# Patient Record
Sex: Male | Born: 1950 | Race: White | Hispanic: No | Marital: Married | Smoking: Never smoker
Health system: Southern US, Community
[De-identification: ages and names within clinical notes are randomized; demographics above are authoritative.]

## PROBLEM LIST (undated history)

## (undated) HISTORY — PX: HERNIA REPAIR: SHX51

## (undated) HISTORY — PX: VASECTOMY: SHX75

## (undated) HISTORY — PX: COLONOSCOPY: SHX174

---

## 2008-04-11 ENCOUNTER — Ambulatory Visit: Payer: Self-pay | Admitting: Unknown Physician Specialty

## 2015-03-20 ENCOUNTER — Other Ambulatory Visit: Payer: Self-pay | Admitting: Physician Assistant

## 2015-03-20 ENCOUNTER — Encounter: Payer: Self-pay | Admitting: Physician Assistant

## 2015-03-20 VITALS — BP 110/60 | HR 70 | Temp 98.0°F

## 2015-03-20 DIAGNOSIS — Z0000 Encounter for general adult medical examination without abnormal findings: Secondary | ICD-10-CM

## 2015-03-20 NOTE — Progress Notes (Signed)
S: needs labwork, likes to keep a check on his psa due to family hx, also family hx diabetes, no complaints, ros neg  O: vitals wnl, nad, lungs c t a, cv rrr, moves extremeties easily, n/v intact  A: yearly labs  P: fasting male exec panel, hgbA1c, vit d

## 2015-03-21 LAB — CMP12+LP+TP+TSH+6AC+PSA+CBC…
ALBUMIN: 4.6 g/dL (ref 3.6–4.8)
ALK PHOS: 60 IU/L (ref 39–117)
ALT: 12 IU/L (ref 0–44)
AST: 24 IU/L (ref 0–40)
Albumin/Globulin Ratio: 2.1 (ref 1.1–2.5)
BASOS ABS: 0 10*3/uL (ref 0.0–0.2)
BILIRUBIN TOTAL: 0.9 mg/dL (ref 0.0–1.2)
BUN / CREAT RATIO: 14 (ref 10–22)
BUN: 13 mg/dL (ref 8–27)
Basos: 1 %
CHLORIDE: 102 mmol/L (ref 97–106)
CHOLESTEROL TOTAL: 130 mg/dL (ref 100–199)
Calcium: 9.3 mg/dL (ref 8.6–10.2)
Chol/HDL Ratio: 3.3 ratio units (ref 0.0–5.0)
Creatinine, Ser: 0.92 mg/dL (ref 0.76–1.27)
EOS (ABSOLUTE): 0.1 10*3/uL (ref 0.0–0.4)
EOS: 2 %
FREE THYROXINE INDEX: 2.1 (ref 1.2–4.9)
GFR calc non Af Amer: 88 mL/min/{1.73_m2} (ref >59)
GFR, EST AFRICAN AMERICAN: 102 mL/min/{1.73_m2} (ref >59)
GGT: 14 IU/L (ref 0–65)
GLUCOSE: 96 mg/dL (ref 65–99)
Globulin, Total: 2.2 g/dL (ref 1.5–4.5)
HDL: 39 mg/dL — AB (ref >39)
HEMOGLOBIN: 15.5 g/dL (ref 12.6–17.7)
Hematocrit: 45.6 % (ref 37.5–51.0)
IMMATURE GRANS (ABS): 0 10*3/uL (ref 0.0–0.1)
IMMATURE GRANULOCYTES: 0 %
Iron: 175 ug/dL — ABNORMAL HIGH (ref 38–169)
LDH: 155 IU/L (ref 121–224)
LDL CALC: 75 mg/dL (ref 0–99)
LYMPHS: 21 %
Lymphocytes Absolute: 0.9 10*3/uL (ref 0.7–3.1)
MCH: 30.2 pg (ref 26.6–33.0)
MCHC: 34 g/dL (ref 31.5–35.7)
MCV: 89 fL (ref 79–97)
MONOCYTES: 11 %
Monocytes Absolute: 0.5 10*3/uL (ref 0.1–0.9)
NEUTROS PCT: 65 %
Neutrophils Absolute: 2.8 10*3/uL (ref 1.4–7.0)
PLATELETS: 172 10*3/uL (ref 150–379)
POTASSIUM: 4.8 mmol/L (ref 3.5–5.2)
PROSTATE SPECIFIC AG, SERUM: 1.6 ng/mL (ref 0.0–4.0)
Phosphorus: 2.7 mg/dL (ref 2.5–4.5)
RBC: 5.13 x10E6/uL (ref 4.14–5.80)
RDW: 13.5 % (ref 12.3–15.4)
SODIUM: 143 mmol/L (ref 136–144)
T3 Uptake Ratio: 30 % (ref 24–39)
T4, Total: 7.1 ug/dL (ref 4.5–12.0)
TSH: 1.27 u[IU]/mL (ref 0.450–4.500)
Total Protein: 6.8 g/dL (ref 6.0–8.5)
Triglycerides: 80 mg/dL (ref 0–149)
Uric Acid: 6.1 mg/dL (ref 3.7–8.6)
VLDL CHOLESTEROL CAL: 16 mg/dL (ref 5–40)
WBC: 4.3 10*3/uL (ref 3.4–10.8)

## 2015-03-21 LAB — VITAMIN D 25 HYDROXY (VIT D DEFICIENCY, FRACTURES): VIT D 25 HYDROXY: 30.4 ng/mL (ref 30.0–100.0)

## 2015-03-21 LAB — HEMOGLOBIN A1C
Est. average glucose Bld gHb Est-mCnc: 120 mg/dL
HEMOGLOBIN A1C: 5.8 % — AB (ref 4.8–5.6)

## 2015-03-22 NOTE — Patient Instructions (Signed)
Prediabetes Eating Plan Prediabetes--also called impaired glucose tolerance or impaired fasting glucose--is a condition that causes blood sugar (blood glucose) levels to be higher than normal. Following a healthy diet can help to keep prediabetes under control. It can also help to lower the risk of type 2 diabetes and heart disease, which are increased in people who have prediabetes. Along with regular exercise, a healthy diet:  Promotes weight loss.  Helps to control blood sugar levels.  Helps to improve the way that the body uses insulin. WHAT DO I NEED TO KNOW ABOUT THIS EATING PLAN?  Use the glycemic index (GI) to plan your meals. The index tells you how quickly a food will raise your blood sugar. Choose low-GI foods. These foods take a longer time to raise blood sugar.  Pay close attention to the amount of carbohydrates in the food that you eat. Carbohydrates increase blood sugar levels.  Keep track of how many calories you take in. Eating the right amount of calories will help you to achieve a healthy weight. Losing about 7 percent of your starting weight can help to prevent type 2 diabetes.  You may want to follow a Mediterranean diet. This diet includes a lot of vegetables, lean meats or fish, whole grains, fruits, and healthy oils and fats. WHAT FOODS CAN I EAT? Grains Whole grains, such as whole-wheat or whole-grain breads, crackers, cereals, and pasta. Unsweetened oatmeal. Bulgur. Barley. Quinoa. Brown rice. Corn or whole-wheat flour tortillas or taco shells. Vegetables Lettuce. Spinach. Peas. Beets. Cauliflower. Cabbage. Broccoli. Carrots. Tomatoes. Squash. Eggplant. Herbs. Peppers. Onions. Cucumbers. Brussels sprouts. Fruits Berries. Bananas. Apples. Oranges. Grapes. Papaya. Mango. Pomegranate. Kiwi. Grapefruit. Cherries. Meats and Other Protein Sources Seafood. Lean meats, such as chicken and [place] or lean cuts of pork and beef. Tofu. Eggs. Nuts. Beans. Dairy Low-fat or  fat-free dairy products, such as yogurt, cottage cheese, and cheese. Beverages Water. Tea. Coffee. Sugar-free or diet soda. Seltzer water. Milk. Milk alternatives, such as soy or almond milk. Condiments Mustard. Relish. Low-fat, low-sugar ketchup. Low-fat, low-sugar barbecue sauce. Low-fat or fat-free mayonnaise. Sweets and Desserts Sugar-free or low-fat pudding. Sugar-free or low-fat ice cream and other frozen treats. Fats and Oils Avocado. Walnuts. Olive oil. The items listed above may not be a complete list of recommended foods or beverages. Contact your dietitian for more options.  WHAT FOODS ARE NOT RECOMMENDED? Grains Refined white flour and flour products, such as bread, pasta, snack foods, and cereals. Beverages Sweetened drinks, such as sweet iced tea and soda. Sweets and Desserts Baked goods, such as cake, cupcakes, pastries, cookies, and cheesecake. The items listed above may not be a complete list of foods and beverages to avoid. Contact your dietitian for more information.   This information is not intended to replace advice given to you by your health care provider. Make sure you discuss any questions you have with your health care provider.   Document Released: 10/02/2014 Document Reviewed: 10/02/2014 Elsevier Interactive Patient Education 2016 [employer] Hemoglobin A1c Test Some of the sugar (glucose) that circulates in your blood sticks or binds to blood proteins. Hemoglobin (Hb or Hgb) is one type of blood protein that glucose binds to. It also carries oxygen in the red blood cells (RBCs). When glucose binds to Hb, the glucose-coated Hb is called glycated Hb. Once Hb is glycated, it remains that way for the life of the RBC. This is about 120 days. Rather than testing your blood glucose level on one single day, the hemoglobin A1c (  HbA1c) test measures the average amount of glycated hemoglobin and, therefore, the average amount of glucose in your blood during the 3-4  months just before the test is done. The HbA1c test is used to monitor long-term control of blood sugar in people who have diabetes mellitus. The HbA1c test can also be used in addition to or in combination with fasting blood glucose level and oral glucose tolerance tests. RESULTS It is your responsibility to obtain your test results. Ask the lab or department performing the test when and how you will get your results. Contact your health care provider to discuss any questions you have about your results. Range of Normal Values Ranges for normal values may vary among different labs and hospitals. You should always check with your health care provider after having lab work or other tests done to discuss the meaning of your test results and whether your values are considered within normal limits. The ranges for normal HbA1c test results are as follows:  Adult or child without diabetes: 4-5.9%.  Adult or child with diabetes and good blood glucose control: less than 6.5%. Several factors can affect HbA1c test results. These may include:  Diseases (hemoglobinopathies) that cause a change in the shape, size, or amount of Hb in your blood.  Longer than normal RBC life span.  Abnormally low levels of certain proteins in your blood.  Eating foods or taking supplements that are high in vitamin C (ascorbic acid). Meaning of Results Outside Normal Value Ranges Abnormally high HbA1c values are most commonly an indication of prediabetes mellitus and diabetes mellitus:  An HbA1c result of 5.7-6.4% is considered diagnostic of prediabetes mellitus.  An HbA1c result of 6.5% or higher on two separate occasions is considered diagnostic of diabetes mellitus. Abnormally low HbA1c values can be caused by several health conditions. These may include:  Pregnancy.  A large amount of blood loss.  Blood transfusions.  Low red blood cell count (anemia). This is caused by premature destruction of red blood  cells.  Long-term kidney failure.  Some unusual forms of Hb (Hb variants), such as sickle cell trait. Discuss your test results with your health care provider. He or she will use the results to make a diagnosis and determine a treatment plan that is right for you.   This information is not intended to replace advice given to you by your health care provider. Make sure you discuss any questions you have with your health care provider.   Document Released: 06/09/2004 Document Revised: 06/08/2014 Document Reviewed: 10/02/2013 Elsevier Interactive Patient Education 2016 Elsevier Inc.  

## 2015-03-22 NOTE — Progress Notes (Signed)
Mailed Lab result and Prediabetic sheets.  SBrown

## 2015-05-10 ENCOUNTER — Ambulatory Visit: Payer: Self-pay | Admitting: Physician Assistant

## 2015-10-02 ENCOUNTER — Ambulatory Visit: Payer: Self-pay | Admitting: Physician Assistant

## 2015-10-02 ENCOUNTER — Encounter: Payer: Self-pay | Admitting: Physician Assistant

## 2015-10-02 VITALS — BP 100/70 | HR 66 | Temp 97.8°F

## 2015-10-02 DIAGNOSIS — M25532 Pain in left wrist: Secondary | ICD-10-CM

## 2015-10-02 DIAGNOSIS — Z299 Encounter for prophylactic measures, unspecified: Secondary | ICD-10-CM

## 2015-10-02 NOTE — Progress Notes (Signed)
S: keeps getting sharp shooting pain in left wrist, happens randomly, no known injury, is r handed, doesn't happen while typing, etc,; has been wearing wrist splint at night  O: vitals wnl, nad, skin intact no bruising or redness noted, no bony tenderness noted, full rom, neg phalens and tinels sign, n/v intact  A: acute wrist pain  P: f/u with [facility] for eval of pain, ? If from medial nerve

## 2015-10-03 LAB — CMP12+LP+TP+TSH+6AC+PSA+CBC…
A/G RATIO: 1.8 (ref 1.2–2.2)
ALT: 17 IU/L (ref 0–44)
AST: 20 IU/L (ref 0–40)
Albumin: 4.4 g/dL (ref 3.6–4.8)
Alkaline Phosphatase: 60 IU/L (ref 39–117)
BUN/Creatinine Ratio: 15 (ref 10–24)
BUN: 13 mg/dL (ref 8–27)
Basophils Absolute: 0 10*3/uL (ref 0.0–0.2)
Basos: 1 %
Bilirubin Total: 0.7 mg/dL (ref 0.0–1.2)
CALCIUM: 9.1 mg/dL (ref 8.6–10.2)
CHLORIDE: 104 mmol/L (ref 96–106)
CHOLESTEROL TOTAL: 133 mg/dL (ref 100–199)
CREATININE: 0.85 mg/dL (ref 0.76–1.27)
Chol/HDL Ratio: 3.2 ratio units (ref 0.0–5.0)
EOS (ABSOLUTE): 0.1 10*3/uL (ref 0.0–0.4)
Eos: 2 %
Estimated CHD Risk: <0.5 times avg. (ref 0.0–1.0)
FREE THYROXINE INDEX: 2.2 (ref 1.2–4.9)
GFR calc Af Amer: 106 mL/min/{1.73_m2} (ref >59)
GFR, EST NON AFRICAN AMERICAN: 92 mL/min/{1.73_m2} (ref >59)
GGT: 15 IU/L (ref 0–65)
GLUCOSE: 100 mg/dL — AB (ref 65–99)
Globulin, Total: 2.4 g/dL (ref 1.5–4.5)
HDL: 42 mg/dL (ref >39)
Hematocrit: 45.8 % (ref 37.5–51.0)
Hemoglobin: 15.1 g/dL (ref 12.6–17.7)
IRON: 123 ug/dL (ref 38–169)
Immature Grans (Abs): 0 10*3/uL (ref 0.0–0.1)
Immature Granulocytes: 0 %
LDH: 149 IU/L (ref 121–224)
LDL Calculated: 78 mg/dL (ref 0–99)
LYMPHS: 23 %
Lymphocytes Absolute: 1.1 10*3/uL (ref 0.7–3.1)
MCH: 30 pg (ref 26.6–33.0)
MCHC: 33 g/dL (ref 31.5–35.7)
MCV: 91 fL (ref 79–97)
MONOS ABS: 0.5 10*3/uL (ref 0.1–0.9)
Monocytes: 10 %
NEUTROS ABS: 3 10*3/uL (ref 1.4–7.0)
Neutrophils: 64 %
PHOSPHORUS: 2.7 mg/dL (ref 2.5–4.5)
POTASSIUM: 4.3 mmol/L (ref 3.5–5.2)
Platelets: 177 10*3/uL (ref 150–379)
Prostate Specific Ag, Serum: 1.7 ng/mL (ref 0.0–4.0)
RBC: 5.04 x10E6/uL (ref 4.14–5.80)
RDW: 12.5 % (ref 12.3–15.4)
Sodium: 142 mmol/L (ref 134–144)
T3 UPTAKE RATIO: 32 % (ref 24–39)
T4 TOTAL: 7 ug/dL (ref 4.5–12.0)
TRIGLYCERIDES: 66 mg/dL (ref 0–149)
TSH: 1.4 u[IU]/mL (ref 0.450–4.500)
Total Protein: 6.8 g/dL (ref 6.0–8.5)
URIC ACID: 6.3 mg/dL (ref 3.7–8.6)
VLDL Cholesterol Cal: 13 mg/dL (ref 5–40)
WBC: 4.7 10*3/uL (ref 3.4–10.8)

## 2015-10-04 LAB — SPECIMEN STATUS REPORT

## 2015-10-04 LAB — HGB A1C W/O EAG: HEMOGLOBIN A1C: 5.8 % — AB (ref 4.8–5.6)

## 2016-05-08 ENCOUNTER — Ambulatory Visit: Payer: Self-pay | Admitting: Registered Nurse

## 2016-05-08 ENCOUNTER — Encounter: Payer: Self-pay | Admitting: Registered Nurse

## 2016-05-08 VITALS — BP 130/70 | HR 78 | Temp 98.5°F

## 2016-05-08 DIAGNOSIS — H6592 Unspecified nonsuppurative otitis media, left ear: Secondary | ICD-10-CM

## 2016-05-08 DIAGNOSIS — J019 Acute sinusitis, unspecified: Secondary | ICD-10-CM

## 2016-05-08 MED ORDER — AMOXICILLIN 500 MG PO CAPS: 500.0000 mg | ORAL_CAPSULE | Freq: Three times a day (TID) | ORAL | 0 refills | Status: AC

## 2016-05-08 MED ORDER — FLUTICASONE PROPIONATE 50 MCG/ACT NA SUSP: 1.0000 | Freq: Two times a day (BID) | NASAL | 0 refills | Status: AC

## 2016-05-08 MED ORDER — SALINE SPRAY 0.65 % NA SOLN: 2.0000 | NASAL | 0 refills | Status: AC | PRN

## 2016-05-08 NOTE — Patient Instructions (Addendum)
Medications as prescribed Aggressive saline nasal use in shower BID Start generic flonase 1 spray each nostril BID after saline use Amoxicillin 500mg  po TID x 10 days for left ear infection

## 2016-05-08 NOTE — Progress Notes (Signed)
Subjective:    Patient ID: Kurt Wiley, male    DOB: Nov 13, 1950, 65 y.o.   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  Patient reported gets sinus infection once a year.  Denied fever, myalgias, ear discharge.  PMHx recurrent sinus infections, seasonal allergies  PSHx denied  FHx denied cancer ENT      Review of Systems  Constitutional: Negative for activity change, appetite change, chills, diaphoresis, fatigue, fever and unexpected weight change.  HENT: Positive for congestion, ear pain, postnasal drip, rhinorrhea, sinus pain, sinus pressure and sore throat. Negative for dental problem, drooling, ear discharge, facial swelling, hearing loss, mouth sores, nosebleeds, sneezing, tinnitus, trouble swallowing and voice change.   Eyes: Negative for photophobia, pain, discharge, redness, itching and visual disturbance.  Respiratory: Negative for cough, choking, chest tightness, shortness of breath, wheezing and stridor.   Cardiovascular: Negative for chest pain, palpitations and leg swelling.  Gastrointestinal: Negative for abdominal distention, abdominal pain, blood in stool, constipation, diarrhea, nausea and vomiting.  Endocrine: Negative for cold intolerance and heat intolerance.  Genitourinary: Negative for dysuria.  Musculoskeletal: Negative for arthralgias, back pain, gait problem, joint swelling, myalgias, neck pain and neck stiffness.  Skin: Negative for color change, pallor, rash and wound.  Allergic/Immunologic: Positive for environmental allergies. Negative for food allergies and immunocompromised state.  Neurological: Positive for headaches. Negative for dizziness, tremors, seizures, syncope, facial  asymmetry, speech difficulty, weakness, light-headedness and numbness.  Hematological: Negative for adenopathy. Does not bruise/bleed easily.  Psychiatric/Behavioral: Negative for agitation, behavioral problems, confusion and sleep disturbance.       Objective:   Physical Exam  Constitutional: He is oriented to person, place, and time. Vital signs are normal. He appears well-developed and well-nourished. He is active and cooperative.  Non-toxic appearance. He does not have a sickly appearance. He does not appear ill. No distress.  HENT:  Head: Normocephalic and atraumatic.  Right Ear: Hearing, external ear and ear canal normal. A middle ear effusion is present.  Left Ear: Hearing, external ear and ear canal normal. Tympanic membrane is erythematous and bulging. A middle ear effusion is present.  Nose: Mucosal edema and rhinorrhea present. No nose lacerations, sinus tenderness, nasal deformity, septal deviation or nasal septal hematoma. No epistaxis.  No foreign bodies. Right sinus exhibits no maxillary sinus tenderness and no frontal sinus tenderness. Left sinus exhibits no maxillary sinus tenderness and no frontal sinus tenderness.  Mouth/Throat: Uvula is midline and mucous membranes are normal. Mucous membranes are not pale, not dry and not cyanotic. He does not have dentures. No oral lesions. No trismus in the jaw. Normal dentition. No dental abscesses, uvula swelling, lacerations or dental caries. Posterior oropharyngeal edema and posterior oropharyngeal erythema present. No oropharyngeal exudate or tonsillar abscesses.  Cobblestoning posterior pharynx; bilateral nasal turbinates edema/erythema clear discharge; left TM bulging erythematous slight opacity air fluid level; right TM air fluid level clear; bilateral allergic shiners  Eyes: Conjunctivae, EOM and lids are normal. Pupils are equal, round, and reactive to light. Right eye exhibits no chemosis, no discharge, no exudate and no hordeolum.  No foreign body present in the right eye. Left eye exhibits no chemosis, no discharge, no exudate and no hordeolum. No foreign body present in the left  eye. Right conjunctiva is not injected. Right conjunctiva has no hemorrhage. Left conjunctiva is not injected. Left conjunctiva has no hemorrhage. No scleral icterus. Right eye exhibits normal extraocular motion and no nystagmus. Left eye exhibits normal extraocular motion and no nystagmus. Right pupil is round and reactive. Left pupil is round and reactive. Pupils are equal.  Neck: Trachea normal, normal range of motion and phonation normal. Neck supple. No tracheal tenderness, no spinous process tenderness and no muscular tenderness present. No neck rigidity. No tracheal deviation, no edema, no erythema and normal range of motion present. No thyroid mass and no thyromegaly present.  Cardiovascular: Normal rate, regular rhythm, S1 normal, S2 normal, normal heart sounds and intact distal pulses.  PMI is not displaced.  Exam reveals no gallop and no friction rub.   No murmur heard. Pulmonary/Chest: Effort normal and breath sounds normal. No stridor. No respiratory distress. He has no decreased breath sounds. He has no wheezes. He has no rhonchi. He has no rales.  Abdominal: Soft. He exhibits no distension.  Musculoskeletal: Normal range of motion. He exhibits no edema or tenderness.       Right shoulder: Normal.       Left shoulder: Normal.       Right elbow: Normal.      Left elbow: Normal.       Right hip: Normal.       Left hip: Normal.       Right knee: Normal.       Left knee: Normal.       Cervical back: Normal.       Thoracic back: Normal.       Lumbar back: Normal.       Right hand: Normal.       Left hand: Normal.  Lymphadenopathy:       Head (right side): No submental, no submandibular, no tonsillar, no preauricular, no posterior auricular and no occipital adenopathy present.       Head (left side): No submental, no submandibular, no  tonsillar, no preauricular, no posterior auricular and no occipital adenopathy present.    He has no cervical adenopathy.       Right cervical: No superficial cervical, no deep cervical and no posterior cervical adenopathy present.      Left cervical: No superficial cervical, no deep cervical and no posterior cervical adenopathy present.  Neurological: He is alert and oriented to person, place, and time. He displays no atrophy and no tremor. No cranial nerve deficit or sensory deficit. He exhibits normal muscle tone. He displays no seizure activity. Coordination and gait normal. GCS eye subscore is 4. GCS verbal subscore is 5. GCS motor subscore is 6.  Skin: Skin is warm, dry and intact. No abrasion, no bruising, no burn, no ecchymosis, no laceration, no lesion, no petechiae and no rash noted. He is not diaphoretic. No cyanosis or erythema. No pallor. Nails show no clubbing.  Psychiatric: He has a normal mood and affect. His speech is normal and behavior is normal. Judgment and thought content normal. Cognition and memory are normal.  Nursing note and vitals reviewed.         Assessment & Plan:  A-acute rhinosinusitis, left otitis media nonsupportive; right otitis media effusion  P-amoxicillin 500mg  po TID x 10 days for left otitis media nonsupportive.  Tylenol 1000mg  po QID prn pain. Supportive treatment.   No evidence of invasive bacterial infection, non toxic and well hydrated.  This is most likely self limiting viral infection.  I do not see where any further testing or imaging is necessary at this time.   I will suggest supportive care, rest, good hygiene and encourage the patient to take adequate fluids.  The patient is to return to clinic or EMERGENCY ROOM if symptoms worsen or change significantly e.g. ear pain, fever, purulent discharge from ears or bleeding.  Exitcare handout on otitis media with effusion given to patient.  Patient verbalized agreement and understanding of treatment plan.     Common in community unlikely flu as no body aches/fever/cough.  Suspect Viral illness: no evidence of invasive bacterial infection, non toxic and well hydrated.  This is most likely self limiting viral infection.  I do not see where any further testing or imaging is necessary at this time.   I will suggest supportive care, rest, good hygiene and encourage the patient to take adequate fluids.  Does not require work excuse. Sudafed 30mg  po q4-6h prn; flonase 1 spray each nostril BID prn, nasal saline 1-2 sprays each nostril prn q2h, motrin 800mg  po TID prn or tylenol 1000mg  po QID prn pain.  Discussed honey with lemon and salt water gargles for comfort also.  The patient is to return to clinic or EMERGENCY ROOM if symptoms worsen or change significantly e.g. fever, lethargy, SOB, wheezing. Patient verbalized agreement and understanding of treatment plan.    May continue sudafed 30mg  1-2 tabs po q4-6h prn rhinitis max 240mg /24 hours.  Hydrate, hydrate, hydrate.  Shower BID.start flonase 1 spray each nostril BID, saline 2 sprays each nostril q2h prn congestion. On amoxicillin 500mg  po TID for ear infection  x 10 days provides coverage for sinusitis also.  Rx given.  No evidence of systemic bacterial infection, non toxic and well hydrated.  I do not see where any further testing or imaging is necessary at this time.   I will suggest supportive care, rest, good hygiene and encourage the patient to take adequate fluids.  The patient is to return to clinic or EMERGENCY ROOM if symptoms worsen or change significantly.  .  Patient verbalized agreement and understanding of treatment plan and had no further questions at this time.   P2:  Hand washing and cover cough  Suspect postnasal drip cause of sore throat but coverage for strep throat also with amoxicillin 500mg  po TID x 10 days for left ear infection.  May use tylenol 1000mg  po QID prn pain. Usually no specific medical treatment is needed if a virus is causing  the sore throat.  The throat most often gets better on its own within 5 to 7 days.  Antibiotic medicine does not cure viral pharyngitis.   For acute pharyngitis caused by bacteria, your healthcare provider will prescribe an antibiotic.  Marland Kitchen Do not smoke.  Marland Kitchen Avoid secondhand smoke and other air pollutants.  . Use a cool mist humidifier to add moisture to the air.  . Get plenty of rest.  . You may want to rest your throat by talking less and eating a diet that is mostly liquid or soft for a day or two.   Marland Kitchen Nonprescription throat lozenges and mouthwashes should help relieve the soreness.   . Gargling with warm saltwater and drinking warm liquids may help.  (You can make a saltwater solution by adding 1/4 teaspoon of salt to 8 ounces, or 240 mL, of warm water.)  . A nonprescription pain reliever such as aspirin, acetaminophen, or ibuprofen may ease general aches and pains.   FOLLOW UP with clinic  provider if no improvements in the next 7-10 days.  Patient verbalized understanding of instructions and agreed with plan of care. P2:  Hand washing and diet.

## 2017-12-10 ENCOUNTER — Encounter: Payer: Self-pay | Admitting: *Deleted

## 2017-12-13 ENCOUNTER — Ambulatory Visit
Admission: RE | Admit: 2017-12-13 | Discharge: 2017-12-13 | Disposition: A | Discharge status: Home / Self Care | Payer: Medicare HMO | Source: Ambulatory Visit | Attending: Unknown Physician Specialty | Admitting: Unknown Physician Specialty

## 2017-12-13 ENCOUNTER — Ambulatory Visit: Payer: Medicare HMO | Admitting: Anesthesiology

## 2017-12-13 ENCOUNTER — Encounter: Payer: Self-pay | Admitting: *Deleted

## 2017-12-13 ENCOUNTER — Encounter
Admission: RE | Disposition: A | Discharge status: Home / Self Care | Payer: Self-pay | Source: Ambulatory Visit | Attending: Unknown Physician Specialty

## 2017-12-13 DIAGNOSIS — Z885 Allergy status to narcotic agent status: Secondary | ICD-10-CM | POA: Insufficient documentation

## 2017-12-13 DIAGNOSIS — Z1211 Encounter for screening for malignant neoplasm of colon: Secondary | ICD-10-CM | POA: Insufficient documentation

## 2017-12-13 DIAGNOSIS — K640 First degree hemorrhoids: Secondary | ICD-10-CM | POA: Insufficient documentation

## 2017-12-13 DIAGNOSIS — K5730 Diverticulosis of large intestine without perforation or abscess without bleeding: Secondary | ICD-10-CM | POA: Insufficient documentation

## 2017-12-13 HISTORY — PX: COLONOSCOPY WITH PROPOFOL: SHX5780

## 2017-12-13 SURGERY — COLONOSCOPY WITH PROPOFOL
Anesthesia: General

## 2017-12-13 MED ORDER — PROPOFOL 500 MG/50ML IV EMUL
INTRAVENOUS | Status: DC | PRN
  Administered 2017-12-13: 50 ug/kg/min via INTRAVENOUS

## 2017-12-13 MED ORDER — MIDAZOLAM HCL 5 MG/5ML IJ SOLN
INTRAMUSCULAR | Status: DC | PRN
  Administered 2017-12-13: 2 mg via INTRAVENOUS

## 2017-12-13 MED ORDER — FENTANYL CITRATE (PF) 100 MCG/2ML IJ SOLN
INTRAMUSCULAR | Status: AC
  Filled 2017-12-13: qty 2

## 2017-12-13 MED ORDER — SODIUM CHLORIDE 0.9 % IV SOLN
INTRAVENOUS | Status: DC
  Administered 2017-12-13: 1000 mL via INTRAVENOUS

## 2017-12-13 MED ORDER — PROPOFOL 10 MG/ML IV BOLUS
INTRAVENOUS | Status: DC | PRN
  Administered 2017-12-13: 20 mg via INTRAVENOUS
  Administered 2017-12-13: 30 mg via INTRAVENOUS

## 2017-12-13 MED ORDER — LIDOCAINE HCL (PF) 2 % IJ SOLN
INTRAMUSCULAR | Status: DC | PRN
  Administered 2017-12-13: 60 mg

## 2017-12-13 MED ORDER — LIDOCAINE HCL (PF) 2 % IJ SOLN
INTRAMUSCULAR | Status: AC
  Filled 2017-12-13: qty 10

## 2017-12-13 MED ORDER — MIDAZOLAM HCL 2 MG/2ML IJ SOLN
INTRAMUSCULAR | Status: AC
  Filled 2017-12-13: qty 2

## 2017-12-13 MED ORDER — FENTANYL CITRATE (PF) 100 MCG/2ML IJ SOLN
INTRAMUSCULAR | Status: DC | PRN
  Administered 2017-12-13 (×2): 50 ug via INTRAVENOUS

## 2017-12-13 MED ORDER — PROPOFOL 500 MG/50ML IV EMUL
INTRAVENOUS | Status: AC
  Filled 2017-12-13: qty 50

## 2017-12-13 MED ORDER — SODIUM CHLORIDE 0.9 % IV SOLN
INTRAVENOUS | Status: DC
  Administered 2017-12-13: 10:00:00 via INTRAVENOUS

## 2017-12-13 NOTE — Anesthesia Postprocedure Evaluation (Signed)
Anesthesia Post Note  Patient: Kurt Wiley  Procedure(s) Performed: COLONOSCOPY WITH PROPOFOL (N/A )  Patient location during evaluation: Endoscopy Anesthesia Type: General Level of consciousness: awake and alert Pain management: pain level controlled Vital Signs Assessment: post-procedure vital signs reviewed and stable Respiratory status: spontaneous breathing, nonlabored ventilation, respiratory function stable and patient connected to nasal cannula oxygen Cardiovascular status: blood pressure returned to baseline and stable Postop Assessment: no apparent nausea or vomiting Anesthetic complications: no     Last Vitals:  Vitals:   12/13/17 1040 12/13/17 1050  BP: 113/77 110/82  Pulse: (!) 58 (!) 59  Resp: 13 13  Temp:    SpO2: 97% 97%    Last Pain:  Vitals:   12/13/17 1030  TempSrc: Tympanic  PainSc:                  Lenard SimmerAndrew Aleatha Taite

## 2017-12-13 NOTE — Transfer of Care (Signed)
Immediate Anesthesia Transfer of Care Note  Patient: Kurt Wiley  Procedure(s) Performed: COLONOSCOPY WITH PROPOFOL (N/A )  Patient Location: PACU  Anesthesia Type:General  Level of Consciousness: sedated  Airway & Oxygen Therapy: Patient Spontanous Breathing and Patient connected to nasal cannula oxygen  Post-op Assessment: Report given to RN and Post -op Vital signs reviewed and stable  Post vital signs: Reviewed and stable  Last Vitals:  Vitals Value Taken Time  BP    Temp    Pulse 64 12/13/2017 10:31 AM  Resp 12 12/13/2017 10:31 AM  SpO2 96 % 12/13/2017 10:31 AM  Vitals shown include unvalidated device data.  Last Pain:  Vitals:   12/13/17 0944  TempSrc: Tympanic  PainSc: 0-No pain         Complications: No apparent anesthesia complications

## 2017-12-13 NOTE — Anesthesia Post-op Follow-up Note (Signed)
Anesthesia QCDR form completed.        

## 2017-12-13 NOTE — Anesthesia Preprocedure Evaluation (Signed)
Anesthesia Evaluation  Patient identified by MRN, date of birth, ID band Patient awake    Reviewed: Allergy & Precautions, H&P , NPO status , Patient's Chart, lab work & pertinent test results, reviewed documented beta blocker date and time   History of Anesthesia Complications Negative for: history of anesthetic complications  Airway Mallampati: I  TM Distance: >3 FB Neck ROM: full    Dental  (+) Dental Advidsory Given, Teeth Intact   Pulmonary neg pulmonary ROS,           Cardiovascular Exercise Tolerance: Good negative cardio ROS   Rate:Normal     Neuro/Psych negative neurological ROS  negative psych ROS   GI/Hepatic negative GI ROS, Neg liver ROS,   Endo/Other  negative endocrine ROS  Renal/GU negative Renal ROS  negative genitourinary   Musculoskeletal   Abdominal   Peds  Hematology negative hematology ROS (+)   Anesthesia Other Findings History reviewed. No pertinent past medical history.   Reproductive/Obstetrics negative OB ROS                             Anesthesia Physical Anesthesia Plan  ASA: I  Anesthesia Plan: General   Post-op Pain Management:    Induction: Intravenous  PONV Risk Score and Plan: 2 and Propofol infusion and TIVA  Airway Management Planned: Nasal Cannula  Additional Equipment:   Intra-op Plan:   Post-operative Plan:   Informed Consent: I have reviewed the patients History and Physical, chart, labs and discussed the procedure including the risks, benefits and alternatives for the proposed anesthesia with the patient or authorized representative who has indicated his/her understanding and acceptance.   Dental Advisory Given  Plan Discussed with: Anesthesiologist, CRNA and Surgeon  Anesthesia Plan Comments:         Anesthesia Quick Evaluation

## 2017-12-13 NOTE — Op Note (Signed)
[facility] Gastroenterology Patient Name: Kurt LikensDavid Lazo Procedure Date: 12/13/2017 10:00 AM MRN: [id number] Account #: [id number] Date of Birth: March 15, 1951 Admit Type: Outpatient Age: [age] Room: [facility] ENDO ROOM 3 Gender: Male Note Status: Finalized Procedure:            Colonoscopy Indications:          Screening for colorectal malignant neoplasm Providers:            Scot Junobert T. Jacquise Rarick, MD Referring MD:         Caryl AspSarah K. Gauger (Referring MD) Medicines:            Propofol per Anesthesia Complications:        No immediate complications. Procedure:            Pre-Anesthesia Assessment:                       - After reviewing the risks and benefits, the patient                        was deemed in satisfactory condition to undergo the                        procedure.                       After obtaining informed consent, the colonoscope was                        passed under direct vision. Throughout the procedure,                        the patient's blood pressure, pulse, and oxygen                        saturations were monitored continuously. The                        Colonoscope was introduced through the anus and                        advanced to the the cecum, identified by appendiceal                        orifice and ileocecal valve. The colonoscopy was                        performed without difficulty. The patient tolerated the                        procedure well. The quality of the bowel preparation                        was excellent. Findings:      A few medium-mouthed diverticula were found in the sigmoid colon.      Internal hemorrhoids were found during endoscopy. The hemorrhoids were       medium-sized and Grade I (internal hemorrhoids that do not prolapse).      The exam was otherwise without abnormality. Impression:           - Diverticulosis in the sigmoid colon.                       -  Internal hemorrhoids.                       - The  examination was otherwise normal.                       - No specimens collected. Recommendation:       - Repeat colonoscopy in 10 years for screening purposes. Scot Jun, MD 12/13/2017 10:30:19 AM This report has been signed electronically. Number of Addenda: 0 Note Initiated On: 12/13/2017 10:00 AM Scope Withdrawal Time: 0 hours 6 minutes 1 second  Total Procedure Duration: 0 hours 9 minutes 16 seconds       [facility]

## 2017-12-13 NOTE — H&P (Signed)
Primary Care Physician:  Myrene BuddyGauger, Sarah Kathryn, NP Primary Gastroenterologist:  Dr. Mechele CollinElliott  Pre-Procedure History & Physical: HPI:  Kurt MaeDavid B Farrelly is a 67 y.o. male is here for an colonoscopy.  Done for screening of the colon for colon polyps or colon cancer,   History reviewed. No pertinent past medical history.  Past Surgical History:  Procedure Laterality Date  . COLONOSCOPY    . HERNIA REPAIR    . VASECTOMY      Prior to Admission medications   Medication Sig Start Date End Date Taking? Authorizing Provider  Multiple Vitamin (MULTIVITAMINS PO) Take 1 tablet by mouth daily. Centrum silver lutein   Yes [provider]  amoxicillin (AMOXIL) 500 MG capsule Take 1 capsule (500 mg total) by mouth 3 (three) times daily. Patient not taking: Reported on 12/13/2017 05/08/16   Barbaraann BarthelBetancourt, Tina A, NP  fluticasone (FLONASE) 50 MCG/ACT nasal spray Place 1 spray into both nostrils 2 (two) times daily. 05/08/16 06/07/16  Betancourt, Jarold Songina A, NP  sodium chloride (OCEAN) 0.65 % SOLN nasal spray Place 2 sprays into both nostrils as needed for congestion. 05/08/16 06/07/16  Barbaraann BarthelBetancourt, Tina A, NP    Allergies as of 09/22/2017 - Review Complete 05/08/2016  Allergen Reaction Noted  . Codeine Nausea Only 03/20/2015    History reviewed. No pertinent family history.  Social History   Socioeconomic History  . Marital status: Married    Spouse name: Not on file  . Number of children: Not on file  . Years of education: Not on file  . Highest education level: Not on file  Occupational History  . Not on file  Social Needs  . Financial resource strain: Not on file  . Food insecurity:    Worry: Not on file    Inability: Not on file  . Transportation needs:    Medical: Not on file    Non-medical: Not on file  Tobacco Use  . Smoking status: Never Smoker  . Smokeless tobacco: Never Used  Substance and Sexual Activity  . Alcohol use: Not Currently    Alcohol/week: 0.0 oz  . Drug use: Never   . Sexual activity: Not on file  Lifestyle  . Physical activity:    Days per week: Not on file    Minutes per session: Not on file  . Stress: Not on file  Relationships  . Social connections:    Talks on phone: Not on file    Gets together: Not on file    Attends religious service: Not on file    Active member of club or organization: Not on file    Attends meetings of clubs or organizations: Not on file    Relationship status: Not on file  . Intimate partner violence:    Fear of current or ex partner: Not on file    Emotionally abused: Not on file    Physically abused: Not on file    Forced sexual activity: Not on file  Other Topics Concern  . Not on file  Social History Narrative  . Not on file    Review of Systems: See HPI, otherwise negative ROS  Physical Exam: BP 120/89   Pulse 66   Resp 18   Ht 6' (1.829 m)   Wt 74.8 kg (165 lb)   SpO2 100%   BMI 22.38 kg/m  General:   Alert,  pleasant and cooperative in NAD Head:  Normocephalic and atraumatic. Neck:  Supple; no masses or thyromegaly. Lungs:  Clear throughout  to auscultation.    Heart:  Regular rate and rhythm. Abdomen:  Soft, nontender and nondistended. Normal bowel sounds, without guarding, and without rebound.   Neurologic:  Alert and  oriented x4;  grossly normal neurologically.  Impression/Plan: Kurt Wiley is here for an colonoscopy to be performed for screening exam.  Risks, benefits, limitations, and alternatives regarding  colonoscopy have been reviewed with the patient.  Questions have been answered.  All parties agreeable.   Lynnae Prude, MD  12/13/2017, 9:58 AM

## 2017-12-14 ENCOUNTER — Encounter: Payer: Self-pay | Admitting: Unknown Physician Specialty

## 2019-05-01 ENCOUNTER — Other Ambulatory Visit: Payer: Self-pay | Admitting: Orthopedic Surgery

## 2019-05-01 DIAGNOSIS — M25562 Pain in left knee: Secondary | ICD-10-CM

## 2019-05-12 ENCOUNTER — Ambulatory Visit
Admission: RE | Admit: 2019-05-12 | Discharge: 2019-05-12 | Disposition: A | Discharge status: Home / Self Care | Payer: Medicare HMO | Source: Ambulatory Visit | Attending: Orthopedic Surgery | Admitting: Orthopedic Surgery

## 2019-05-12 ENCOUNTER — Other Ambulatory Visit: Payer: Self-pay

## 2019-05-12 DIAGNOSIS — M25562 Pain in left knee: Secondary | ICD-10-CM | POA: Insufficient documentation

## 2020-05-05 IMAGING — MR MR KNEE*L* W/O CM
6 series · 40 of 40 positions shown · non-contrast
Comparison: None.

CLINICAL DATA: Medial left knee pain since December 2018. No known
injury.

EXAM:
MRI OF THE LEFT KNEE WITHOUT CONTRAST
TECHNIQUE: Multiplanar, multisequence MR imaging of the knee was performed. No
intravenous contrast was administered.

[Series 8: T2 fat-sat · axial · left · 4.0mm · 0.50mm/px · z∈[-59,+65]mm · 5 of 26 slices shown (1 of 3)]
[im 1/26]
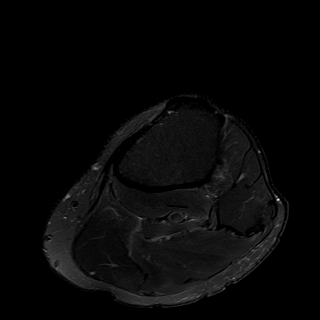
[im 7/26]
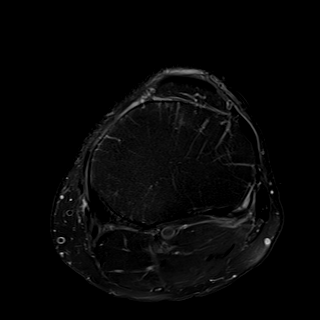
[im 13/26]
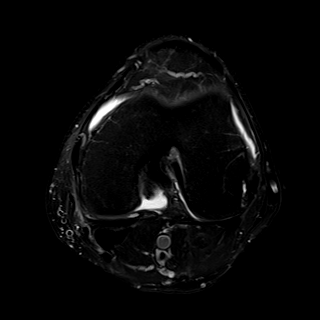
[im 19/26]
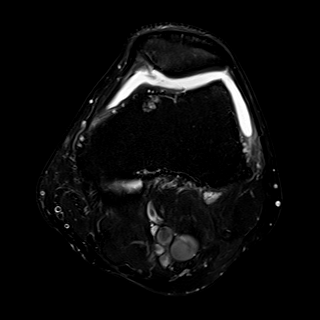
[im 26/26]
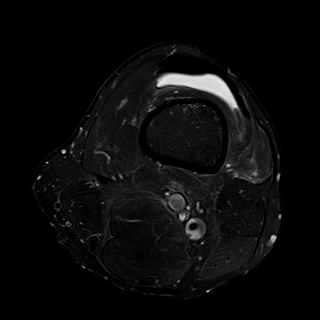

[Series 9: T1 · coronal · left · 4.0mm · 0.42mm/px · 7 of 32 slices shown]
[im 1/32]
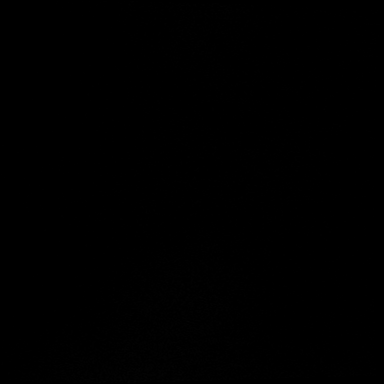
[im 6/32]
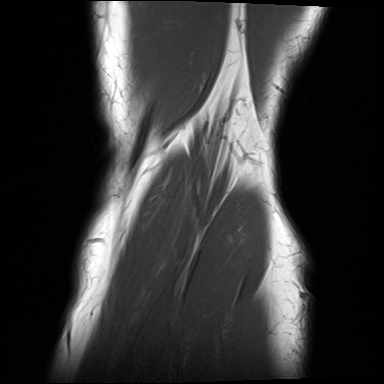
[im 11/32]
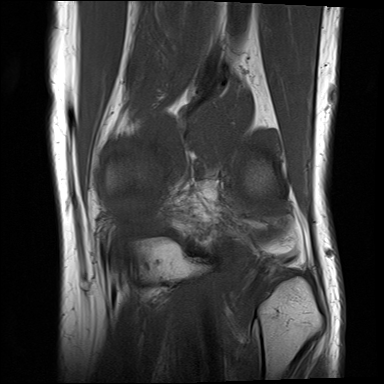
[im 16/32]
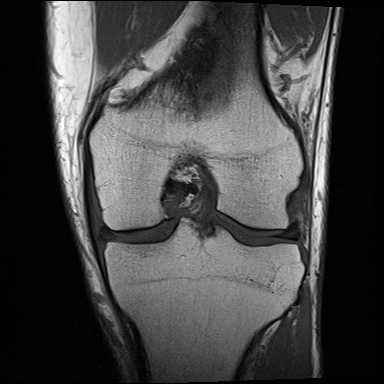
[im 21/32]
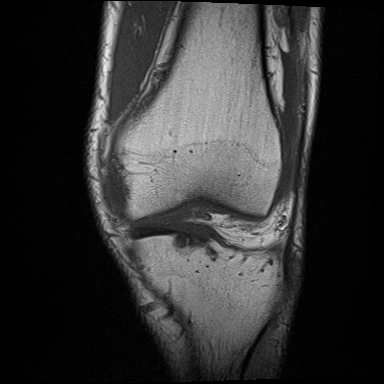
[im 26/32]
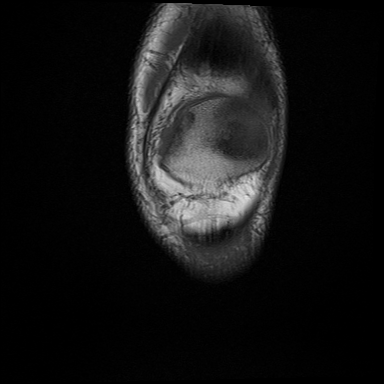
[im 32/32]
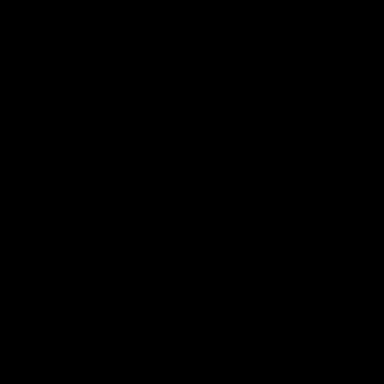

[Series 10: T2 fat-sat · coronal · left · 4.0mm · 0.59mm/px · 7 of 31 slices shown (2 of 3)]
[im 1/31]
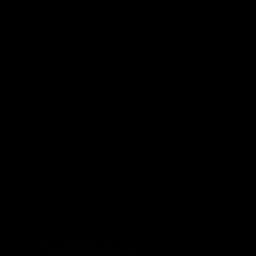
[im 6/31]
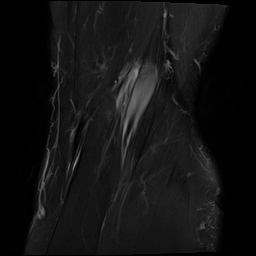
[im 11/31]
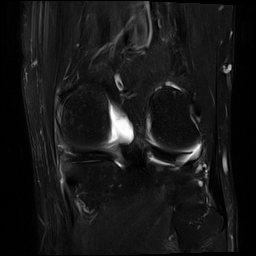
[im 16/31]
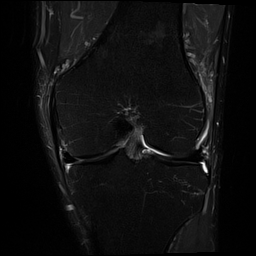
[im 21/31]
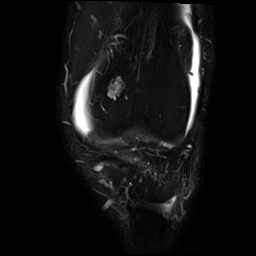
[im 26/31]
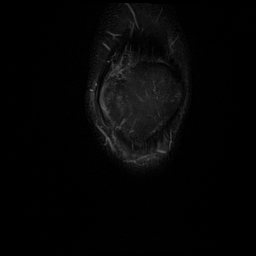
[im 31/31]
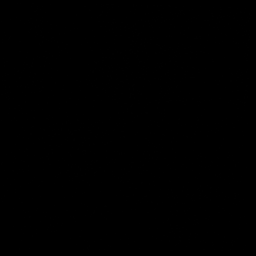

[Series 11: PD fat-sat · coronal · left · 4.0mm · 0.59mm/px · 7 of 32 slices shown (1 of 2)]
[im 1/32]
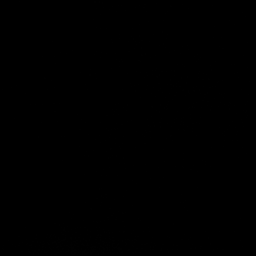
[im 6/32]
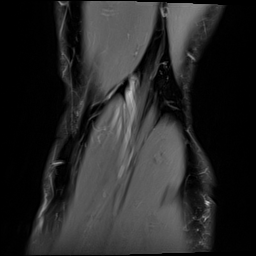
[im 11/32]
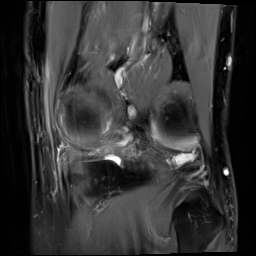
[im 16/32]
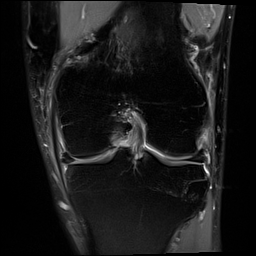
[im 21/32]
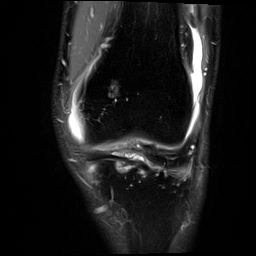
[im 26/32]
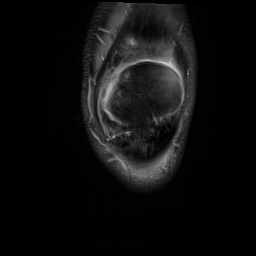
[im 32/32]
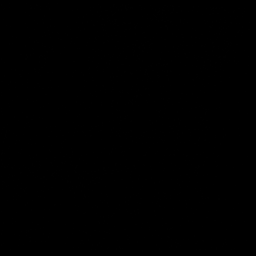

[Series 12: PD fat-sat · sagittal · left · 3.0mm · 0.59mm/px · 7 of 32 slices shown (2 of 2)]
[im 1/32]
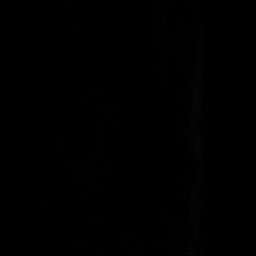
[im 6/32]
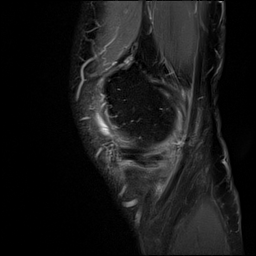
[im 11/32]
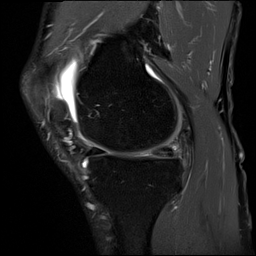
[im 16/32]
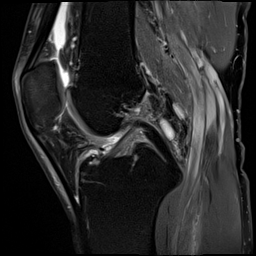
[im 21/32]
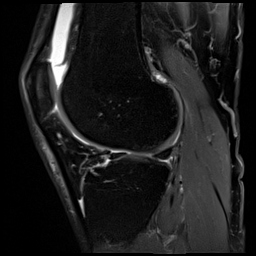
[im 26/32]
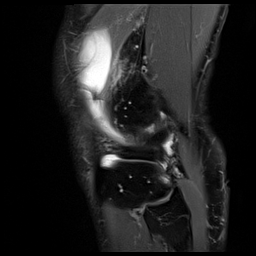
[im 32/32]
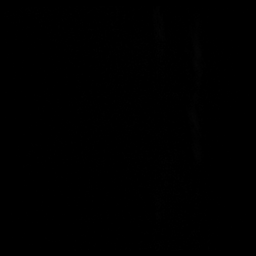

[Series 13: T2 fat-sat · sagittal · left · 3.0mm · 0.59mm/px · 7 of 35 slices shown (3 of 3)]
[im 1/35]
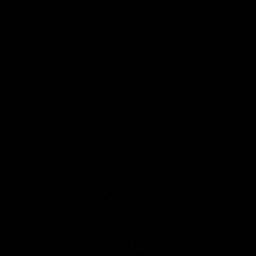
[im 6/35]
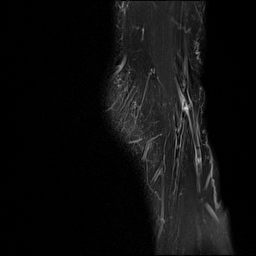
[im 12/35]
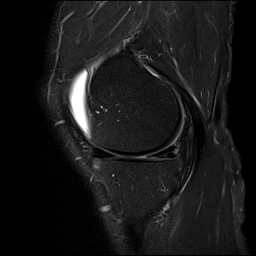
[im 18/35]
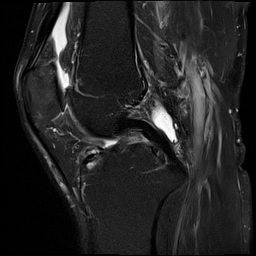
[im 23/35]
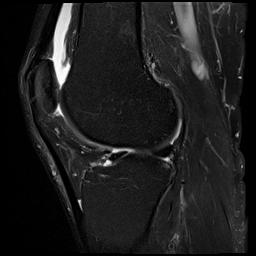
[im 29/35]
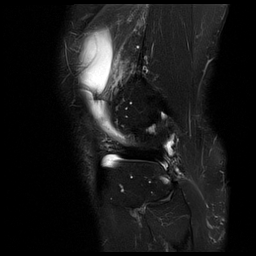
[im 35/35]
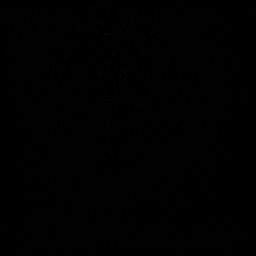

[40 of 40 positions shown; findings below may reference images not displayed]

FINDINGS: MENISCI

Medial meniscus: A horizontal tear reaches the meniscal undersurface
and extends from the root of the posterior horn to the mid meniscal
body.

Lateral meniscus:  Intact.

LIGAMENTS

Cruciates:  Intact.

Collaterals:  Intact.

CARTILAGE

Patellofemoral: There is some cartilage thinning along the medial
facet and femoral trochlea. No focal defect.

Medial:  Mildly degenerated.

Lateral:  Preserved.

Joint:  Small effusion.

Popliteal Fossa:  No Baker's cyst.

Extensor Mechanism:  Intact.

Bones:  No fracture or focal lesion.

Other: None.
IMPRESSION: 1. Horizontal tear posterior horn and body of the medial meniscus
reaches the meniscal undersurface.
2. Mild patellofemoral and medial compartment osteoarthritis.
